# Patient Record
Sex: Female | Born: 1970 | Race: White | Hispanic: Yes | Marital: Single
Health system: Midwestern US, Academic
[De-identification: ages and names within clinical notes are randomized; demographics above are authoritative.]

---

## 2017-03-17 ENCOUNTER — Encounter: Admit: 2017-03-17 | Discharge: 2017-03-17 | Payer: MEDICAID

## 2017-03-17 NOTE — Telephone Encounter
Pt- Requesting venlafaxine XR (EFFEXOR XR) 37.5 mg capsule refill from pharmacy and needs confirmation that this will be approved, call [phone].

## 2017-03-17 NOTE — Telephone Encounter
Per office protocol patient needs an appointment before refill can be give.  Patient notified of this an appointment was made with Ferdinand Lango.  Patient requested that a sign language interpreter be present.  An e-mail was sent asking for one to be at her appointment.

## 2017-03-30 ENCOUNTER — Encounter: Admit: 2017-03-30 | Discharge: 2017-03-30 | Payer: MEDICAID

## 2017-03-30 ENCOUNTER — Ambulatory Visit: Admit: 2017-03-30 | Discharge: 2017-03-30 | Payer: MEDICAID

## 2017-03-30 DIAGNOSIS — R42 Dizziness and giddiness: ICD-10-CM

## 2017-03-30 DIAGNOSIS — H9190 Unspecified hearing loss, unspecified ear: ICD-10-CM

## 2017-03-30 DIAGNOSIS — R51 Headache: ICD-10-CM

## 2017-03-30 DIAGNOSIS — M1990 Unspecified osteoarthritis, unspecified site: ICD-10-CM

## 2017-03-30 DIAGNOSIS — E119 Type 2 diabetes mellitus without complications: ICD-10-CM

## 2017-03-30 DIAGNOSIS — E7800 Pure hypercholesterolemia, unspecified: ICD-10-CM

## 2017-03-30 DIAGNOSIS — J45909 Unspecified asthma, uncomplicated: Principal | ICD-10-CM

## 2017-03-30 MED ORDER — VENLAFAXINE 37.5 MG PO CP24
ORAL_CAPSULE | 3 refills | Status: AC
Start: 2017-03-30 — End: 2017-10-05

## 2017-03-30 NOTE — Progress Notes
Date of Service: 03/30/2017    Subjective:             Tonya Hayes is a 46 y.o. female.    History of Present Illness    Tonya Hayes is here for annual follow up.  She reports significant improvement in her vertigo symptoms with effexor.  She is taking 75mg /day and tolerating side effects well.  Get gets labs annually with PCP.  She is very happy with her progress and does not want to wean off.  She does still get a little off with up and down/extra movement at work as well as when her stressors are high.  If possible she would like to tighten symptoms control.       Review of Systems   HENT: Positive for tinnitus.    Eyes: Negative.    Respiratory: Negative.    Cardiovascular: Negative.    Gastrointestinal: Negative.    Endocrine: Negative.    Genitourinary: Negative.    Musculoskeletal: Negative.    Skin: Negative.    Allergic/Immunologic: Negative.    Neurological: Positive for light-headedness.   Hematological: Negative.    Psychiatric/Behavioral: Negative.          Objective:         ??? ALBUTEROL IN Inhale  by mouth into the lungs.   ??? aspirin 81 mg chewable tablet Chew 81 mg by mouth as Needed for Pain. Take with food.   ??? bevacizumab (AVASTIN) 25 mg/mL injection Administer  through vein once.   ??? dapagliflozin 10 mg tab Take 10 mg by mouth daily with breakfast.   ??? ondansetron (ZOFRAN) 4 mg tablet Take 4 mg by mouth as Needed for Nausea or Vomiting.   ??? Saxagliptin 5 mg tab Take 5 mg by mouth daily.   ??? venlafaxine XR (EFFEXOR XR) 37.5 mg capsule Take three tabs po qd.  Take with food.     Vitals:    03/30/17 1416   BP: 142/82   Pulse: 93   Weight: 71.2 kg (157 lb)   Height: 149.9 cm (59)     Body mass index is 31.71 kg/m???.     Physical Exam    General appearance: well developed, well nourished, no acute distress  Communication ability: communicates by voice, normal quality  Psychiatric: oriented to person, place and time with appropriate affect Inspection: normocephalic, no scars, lesions, or masses.  Facial motion grossly intact.  External ear: normal, no lesions or deformities no post auricular fluctuance or tenderness.  Otoscopic: canals clear, tympanic membranes intact, no fluid         Assessment and Plan:  Tonya Hayes is here for annual follow up.  She reports significant improvement in her vertigo symptoms with effexor.  She is taking 75mg /day and tolerating side effects well.  Get gets labs annually with PCP.  She is very happy with her progress and does not want to wean off.  She does still get a little off with up and down/extra movement at work as well as when her stressors are high.  If possible she would like to tighten symptoms control.      Exam normal.    1. Vertigo         Doing very well on 75mg  effexor xr per day.  Will try to tighten control of breakthrough syptoms with increasing to 112.5mg / day (three 37.5 mg tabs).  Can go up to 150mg /day if needed.  Tonya Hayes expressed understanding and appreciation.    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

## 2017-09-26 ENCOUNTER — Encounter: Admit: 2017-09-26 | Discharge: 2017-09-27 | Payer: MEDICAID

## 2017-10-05 ENCOUNTER — Encounter: Admit: 2017-10-05 | Discharge: 2017-10-05 | Payer: MEDICAID

## 2017-10-05 MED ORDER — VENLAFAXINE 75 MG PO CP24
75 mg | ORAL_CAPSULE | Freq: Every day | ORAL | 3 refills | Status: AC
Start: 2017-10-05 — End: 2018-12-04

## 2017-10-25 ENCOUNTER — Ambulatory Visit: Admit: 2017-10-25 | Discharge: 2017-10-25 | Payer: MEDICAID

## 2017-10-25 ENCOUNTER — Encounter: Admit: 2017-10-25 | Discharge: 2017-10-25 | Payer: MEDICAID

## 2017-10-25 DIAGNOSIS — R51 Headache: ICD-10-CM

## 2017-10-25 DIAGNOSIS — J45909 Unspecified asthma, uncomplicated: Principal | ICD-10-CM

## 2017-10-25 DIAGNOSIS — E7800 Pure hypercholesterolemia, unspecified: ICD-10-CM

## 2017-10-25 DIAGNOSIS — M1990 Unspecified osteoarthritis, unspecified site: ICD-10-CM

## 2017-10-25 DIAGNOSIS — E119 Type 2 diabetes mellitus without complications: ICD-10-CM

## 2017-10-25 DIAGNOSIS — R42 Dizziness and giddiness: ICD-10-CM

## 2017-10-25 DIAGNOSIS — M7712 Lateral epicondylitis, left elbow: Principal | ICD-10-CM

## 2017-10-25 DIAGNOSIS — H9190 Unspecified hearing loss, unspecified ear: ICD-10-CM

## 2018-02-15 ENCOUNTER — Encounter: Admit: 2018-02-15 | Discharge: 2018-02-15 | Payer: MEDICAID

## 2018-12-03 ENCOUNTER — Encounter: Admit: 2018-12-03 | Discharge: 2018-12-03

## 2018-12-03 NOTE — Telephone Encounter
PT Calling to reach out on medication to arrive but hasnt

## 2018-12-04 ENCOUNTER — Encounter: Admit: 2018-12-04 | Discharge: 2018-12-04

## 2018-12-04 MED ORDER — VENLAFAXINE 75 MG PO CP24
75 mg | ORAL_CAPSULE | Freq: Every day | ORAL | 0 refills | Status: DC
Start: 2018-12-04 — End: 2019-02-20

## 2018-12-04 NOTE — Telephone Encounter
It has been > 1 year since last seen.  She will need an in person office visit, telehealth (zoom or phone), OR better yet transfer rx to PCP.

## 2019-02-14 ENCOUNTER — Encounter: Admit: 2019-02-14 | Discharge: 2019-02-14

## 2019-02-14 NOTE — Telephone Encounter
Pt calling for refill on her Prescription

## 2019-02-14 NOTE — Telephone Encounter
Called patient and left a message with her interpreter saying that before Tonya Hayes will refill her medication she needs to call and schedule an appointment because its been over a year since she was seen. Patient is hearing impaired so I left a message.

## 2019-02-20 ENCOUNTER — Encounter: Admit: 2019-02-20 | Discharge: 2019-02-20

## 2019-02-20 MED ORDER — VENLAFAXINE 75 MG PO CP24
75 mg | ORAL_CAPSULE | Freq: Every day | ORAL | 0 refills | Status: DC
Start: 2019-02-20 — End: 2019-04-16

## 2019-02-20 NOTE — Telephone Encounter
Called patient to pre room her for a telephone visit. Patient did not answer. LVM for her to call back.

## 2019-02-21 ENCOUNTER — Encounter: Admit: 2019-02-21 | Discharge: 2019-02-21

## 2019-04-16 ENCOUNTER — Encounter: Admit: 2019-04-16 | Discharge: 2019-04-16 | Payer: MEDICAID

## 2019-04-16 DIAGNOSIS — H9190 Unspecified hearing loss, unspecified ear: Secondary | ICD-10-CM

## 2019-04-16 DIAGNOSIS — J45909 Unspecified asthma, uncomplicated: Secondary | ICD-10-CM

## 2019-04-16 DIAGNOSIS — R519 Generalized headaches: Secondary | ICD-10-CM

## 2019-04-16 DIAGNOSIS — E119 Type 2 diabetes mellitus without complications: Secondary | ICD-10-CM

## 2019-04-16 DIAGNOSIS — E7800 Pure hypercholesterolemia, unspecified: Secondary | ICD-10-CM

## 2019-04-16 DIAGNOSIS — M1990 Unspecified osteoarthritis, unspecified site: Secondary | ICD-10-CM

## 2019-04-16 DIAGNOSIS — R42 Dizziness and giddiness: Secondary | ICD-10-CM

## 2019-04-16 NOTE — Telephone Encounter
Calling regarding a refill

## 2019-11-26 ENCOUNTER — Encounter: Admit: 2019-11-26 | Discharge: 2019-11-26 | Payer: MEDICAID

## 2019-11-26 ENCOUNTER — Ambulatory Visit: Admit: 2019-11-26 | Discharge: 2019-11-26 | Payer: MEDICAID

## 2019-11-26 DIAGNOSIS — H9190 Unspecified hearing loss, unspecified ear: Secondary | ICD-10-CM

## 2019-11-26 DIAGNOSIS — R519 Generalized headaches: Secondary | ICD-10-CM

## 2019-11-26 DIAGNOSIS — J45909 Unspecified asthma, uncomplicated: Secondary | ICD-10-CM

## 2019-11-26 DIAGNOSIS — E119 Type 2 diabetes mellitus without complications: Secondary | ICD-10-CM

## 2019-11-26 DIAGNOSIS — M1990 Unspecified osteoarthritis, unspecified site: Secondary | ICD-10-CM

## 2019-11-26 DIAGNOSIS — E7800 Pure hypercholesterolemia, unspecified: Secondary | ICD-10-CM

## 2019-11-26 DIAGNOSIS — R42 Dizziness and giddiness: Secondary | ICD-10-CM

## 2019-11-26 MED ORDER — VENLAFAXINE 75 MG PO CP24
75 mg | ORAL_CAPSULE | Freq: Every day | ORAL | 11 refills | Status: AC
Start: 2019-11-26 — End: ?

## 2019-11-26 NOTE — Progress Notes
Date of Service: 11/26/2019    Subjective:             Tonya Hayes is a 49 y.o. female.    History of Present Illness    Tonya Hayes is here for vertigo/medication refill.    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  She has been doing well with effexor.  She currently continues to get at least annual visits with her PCP including labs.    ?  To review, she had been having nearly constant vague disequilibrium and nausea that had started with a few severe vertigo episodes 2016. 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 ?When she presented initially 08/2015 here, she was fairly dependent on scopolamine, indicating as long as she is on the patch she is mostly ok but when the 72 hours is drawing near for patch replacement, she starts getting dizzy again. ?  ?  She was sent for vestibular testing but couldn't even get through a few seconds of testing. ?This is quite unusual and suspicious for a significant anxiety component. ?She ultimately was started on effexor and today reports she has tolerated it well, her dizziness is quite well controlled now and she is very happy with that.       Review of Systems   Constitutional: Negative.    HENT: Negative.    Eyes: Negative.    Respiratory: Negative.    Cardiovascular: Negative.    Gastrointestinal: Negative.    Endocrine: Negative.    Genitourinary: Negative.    Musculoskeletal: Negative.    Skin: Negative.    Allergic/Immunologic: Negative.    Neurological: Negative.    Hematological: Negative.    Psychiatric/Behavioral: Negative.          Objective:         ? ALBUTEROL IN Inhale  by mouth into the lungs.   ? aspirin 81 mg chewable tablet Chew 81 mg by mouth as Needed for Pain. Take with food.   ? bevacizumab (AVASTIN) 25 mg/mL injection Administer  through vein once.   ? dapagliflozin 10 mg tab Take 10 mg by mouth daily with breakfast.   ? ondansetron (ZOFRAN) 4 mg tablet Take 4 mg by mouth as Needed for Nausea or Vomiting.   ? Saxagliptin 5 mg tab Take 5 mg by mouth daily.   ? venlafaxine XR (EFFEXOR XR) 75 mg capsule Take one capsule by mouth daily. Take with food.     Vitals:    11/26/19 1350   BP: (!) 159/83   Pulse: 98   Weight: 72.6 kg (160 lb)   Height: 149.9 cm (59)   PainSc: Zero     Body mass index is 32.32 kg/m?Marland Kitchen     Physical Exam    General appearance: well developed, well nourished, no acute distress  Communication ability: communicates by voice, normal quality  Psychiatric: oriented to person, place and time with appropriate affect  Inspection: normocephalic, no scars, lesions, or masses.  Facial motion grossly intact.  External ear: normal, no lesions or deformities no post auricular fluctuance or tenderness.  Otoscopic: canals clear, tympanic membranes intact, no fluid       Assessment and Plan:  1. Vertigo         Effexor XR 75mg  refilled x 1 year.  Can transfer rx to PCP or return annually.  Discussed weaning but this is difficult for some patients with this medication; she is doing well and would like to stay at  this dose which is reasonable.  Continue routine follow up/labs with PCP, otherwise RTC to see me in 1 year.  Tonya Hayes expressed understanding and appreciation.    Interview taken via in person translator as the patient is ASL speaking only.

## 2020-05-23 ENCOUNTER — Encounter: Admit: 2020-05-23 | Discharge: 2020-05-23 | Payer: MEDICAID

## 2020-05-24 MED ORDER — VENLAFAXINE 75 MG PO CP24
ORAL_CAPSULE | Freq: Every day | 11 refills | Status: AC
Start: 2020-05-24 — End: ?

## 2021-02-04 ENCOUNTER — Ambulatory Visit: Admit: 2021-02-04 | Discharge: 2021-02-04 | Payer: MEDICAID

## 2021-02-04 ENCOUNTER — Encounter: Admit: 2021-02-04 | Discharge: 2021-02-04 | Payer: MEDICAID

## 2021-02-04 DIAGNOSIS — K029 Dental caries, unspecified: Secondary | ICD-10-CM

## 2021-02-04 DIAGNOSIS — H9190 Unspecified hearing loss, unspecified ear: Secondary | ICD-10-CM

## 2021-02-04 DIAGNOSIS — R42 Dizziness and giddiness: Secondary | ICD-10-CM

## 2021-02-04 DIAGNOSIS — E7800 Pure hypercholesterolemia, unspecified: Secondary | ICD-10-CM

## 2021-02-04 DIAGNOSIS — R6884 Jaw pain: Secondary | ICD-10-CM

## 2021-02-04 DIAGNOSIS — H903 Sensorineural hearing loss, bilateral: Secondary | ICD-10-CM

## 2021-02-04 DIAGNOSIS — J45909 Unspecified asthma, uncomplicated: Secondary | ICD-10-CM

## 2021-02-04 DIAGNOSIS — E119 Type 2 diabetes mellitus without complications: Secondary | ICD-10-CM

## 2021-02-04 DIAGNOSIS — H9202 Otalgia, left ear: Secondary | ICD-10-CM

## 2021-02-04 DIAGNOSIS — M1990 Unspecified osteoarthritis, unspecified site: Secondary | ICD-10-CM

## 2021-02-04 DIAGNOSIS — R519 Generalized headaches: Secondary | ICD-10-CM

## 2021-02-04 NOTE — Progress Notes
No audiogram needed.

## 2021-02-04 NOTE — Progress Notes
Date of Service: 02/04/2021    Subjective:             Tonya Hayes is a 50 y.o. female.    History of Present Illness    Tonya Hayes is here for L TMJ pain.  This started suddenly, with a pop, and has hurt ever since.  There is pain with opening the jaw.  It feels like it is in her ear, too.  She tried seeing her dentist but they could not get an interpreter and would not see her.  It seems there was some communication issues.  She knows she needs to see a dentist for oral/dental health.  She is deaf and communicates via ASL.      Otherwise, she has done well on effexor for central vertigo.  However, her PCP has told her she would like her to go down on the dose due to the potential for negative impact on diabetes.       Review of Systems   Constitutional: Negative.    HENT: Positive for ear pain (pressure, Left ear).    Eyes: Negative.    Respiratory: Negative.    Cardiovascular: Negative.    Gastrointestinal: Negative.    Endocrine: Negative.    Genitourinary: Negative.    Musculoskeletal: Negative.    Skin: Negative.    Allergic/Immunologic: Negative.    Neurological: Negative.    Hematological: Negative.    Psychiatric/Behavioral: Negative.          Objective:         ? ALBUTEROL IN Inhale  by mouth into the lungs.   ? aspirin 81 mg chewable tablet Chew 81 mg by mouth as Needed for Pain. Take with food.   ? atorvastatin (LIPITOR) 10 mg tablet    ? bevacizumab (AVASTIN) 25 mg/mL injection Administer  through vein once.   ? cyclobenzaprine (FLEXERIL) 5 mg tablet Take 5 mg by mouth at bedtime daily.   ? dapagliflozin 10 mg tab Take 10 mg by mouth daily with breakfast.   ? diazePAM (VALIUM) 5 mg tablet TAKE 1 TABLET BY MOUTH AT BEDTIME AS NEEDED FOR ANXIETY   ? glipiZIDE (GLUCOTROL) 5 mg tablet Take 5 mg by mouth daily.   ? JARDIANCE 10 mg tablet Take 10 mg by mouth every morning.   ? metFORMIN-XR (GLUCOPHAGE XR) 500 mg extended release tablet    ? ondansetron (ZOFRAN) 4 mg tablet Take 4 mg by mouth as Needed for Nausea or Vomiting.   ? Saxagliptin 5 mg tab Take 5 mg by mouth daily.   ? venlafaxine XR (EFFEXOR XR) 75 mg capsule TAKE 1 CAPSULE BY MOUTH DAILY WITH FOOD     Vitals:    02/04/21 1032   BP: (!) 144/84   Pulse: 81   PainSc: Two   Weight: 66.7 kg (147 lb 0.8 oz)   Height: 149 cm (4' 10.66)     Body mass index is 30.04 kg/m?Marland Kitchen     Physical Exam    General appearance: well developed, well nourished, no acute distress  Communication ability: communicates by ASL, via interpreter  Psychiatric: oriented to person, place and time with appropriate affect  Inspection: normocephalic, no scars, lesions, or masses.  Facial motion grossly intact.  External ear: normal, no lesions or deformities no post auricular fluctuance or tenderness.  Otoscopic: canals clear, tympanic membranes intact, no fluid  External nose: normal, no lesions or deformities  Lips/teeth/gums: poor dentition, no obvious abscess, no discrete tooth pain  Oropharynx: tongue  normal, posterior pharynx without erythema or exudate.  Hard and soft palate without focal lesion.  Neck: supple, no mass or lesion.  TMJ exquisitely tender.     Assessment and Plan:    1. Left ear pain     2. Dental caries  AMB REFERRAL TO DENTISTRY-EXTERNAL   3. Jaw pain  AMB REFERRAL TO DENTISTRY-EXTERNAL   4. Vertigo       She has ear pain related to jaw problems.  Recommend gentle stretching, massage, scheduled NSAID with food, warm compress.  She has poor dental health.  Referral placed to [facility] and she was given contact information.      It is ok to decrease the effexor dose, will leave to PCP.  She understands she may experience more vertigo, and we can address that if need be.  Tonya Hayes expressed understanding and appreciation.

## 2021-02-05 ENCOUNTER — Encounter: Admit: 2021-02-05 | Discharge: 2021-02-05 | Payer: MEDICAID

## 2021-12-27 ENCOUNTER — Encounter: Admit: 2021-12-27 | Discharge: 2021-12-27 | Payer: MEDICAID

## 2021-12-27 NOTE — Telephone Encounter
Pt calling regarding a refill on her Vertigo medication wasn't sure of the name

## 2021-12-28 ENCOUNTER — Encounter: Admit: 2021-12-28 | Discharge: 2021-12-28 | Payer: MEDICAID

## 2021-12-28 NOTE — Telephone Encounter
This nurse returned patient call regarding medication refill. This nurse LVM for patient to call clinic back to discuss refill. [phone].    Stanford Breed, RN

## 2022-01-10 ENCOUNTER — Encounter: Admit: 2022-01-10 | Discharge: 2022-01-10 | Payer: MEDICAID

## 2022-01-10 NOTE — Telephone Encounter
Patient last seen  20222. Asking for refill of balance medication be sent to [facility] at  [phone] , [place] . Patient did not want to make appointment.

## 2022-01-10 NOTE — Telephone Encounter
This nurse called patient and used interpreter services. Patient is requesting a refill on her Effexor. This nurse informed patient per Ferdinand Lango PA-C office note "It is ok to decrease the effexor dose, will leave to PCP." This nurse informed patient that she would need to follow-up with pcp for refills. Patient expressed understanding and will contact pcp office. [phone].      Stanford Breed, RN

## 2022-01-18 ENCOUNTER — Encounter: Admit: 2022-01-18 | Discharge: 2022-01-18 | Payer: MEDICAID

## 2022-01-18 MED ORDER — VENLAFAXINE 75 MG PO CP24
75 mg | ORAL_CAPSULE | Freq: Every day | ORAL | 1 refills | Status: AC
Start: 2022-01-18 — End: ?

## 2022-01-19 ENCOUNTER — Encounter: Admit: 2022-01-19 | Discharge: 2022-01-19 | Payer: MEDICAID

## 2022-01-19 NOTE — Telephone Encounter
This nurse called and LVM using interpreter to let patient know that provider sent in a 30 day refill plus refill of Effexor. Patient was instructed to call clinic back to schedule follow-up appointment to determine pcp and to discuss who will take over medication. [phone].    Stanford Breed, RN

## 2022-02-09 ENCOUNTER — Encounter: Admit: 2022-02-09 | Discharge: 2022-02-09 | Payer: MEDICAID

## 2022-06-09 ENCOUNTER — Encounter: Admit: 2022-06-09 | Discharge: 2022-06-09 | Payer: MEDICAID

## 2022-06-09 ENCOUNTER — Ambulatory Visit: Admit: 2022-06-09 | Discharge: 2022-06-09 | Payer: MEDICAID

## 2022-06-09 DIAGNOSIS — H6121 Impacted cerumen, right ear: Secondary | ICD-10-CM

## 2022-06-09 DIAGNOSIS — E119 Type 2 diabetes mellitus without complications: Secondary | ICD-10-CM

## 2022-06-09 DIAGNOSIS — H903 Sensorineural hearing loss, bilateral: Secondary | ICD-10-CM

## 2022-06-09 DIAGNOSIS — M1990 Unspecified osteoarthritis, unspecified site: Secondary | ICD-10-CM

## 2022-06-09 DIAGNOSIS — J45909 Unspecified asthma, uncomplicated: Secondary | ICD-10-CM

## 2022-06-09 DIAGNOSIS — R519 Generalized headaches: Secondary | ICD-10-CM

## 2022-06-09 DIAGNOSIS — R42 Dizziness and giddiness: Secondary | ICD-10-CM

## 2022-06-09 DIAGNOSIS — R051 Acute cough: Secondary | ICD-10-CM

## 2022-06-09 DIAGNOSIS — E7800 Pure hypercholesterolemia, unspecified: Secondary | ICD-10-CM

## 2022-06-09 DIAGNOSIS — H9312 Tinnitus, left ear: Secondary | ICD-10-CM

## 2022-06-09 DIAGNOSIS — H9190 Unspecified hearing loss, unspecified ear: Secondary | ICD-10-CM

## 2022-06-09 NOTE — Progress Notes
Date of Service: 06/09/2022    Subjective:             Tonya Hayes is a 51 y.o. female.    History of Present Illness    Tonya Hayes is here for increased tinnitus.  Difficult historian, we do have an ASL interpreter with [place] today.      She has been able to get her effexor through her PCP.  However, she indicates at some point, the refills stopped and she has been just waiting.  Through a series of questions, I was able to discern she has only been without for a few days.      Increased tinnitus and dizziness started with a head injury; she fell and hit her head and had disorientation.  She was evaluated in the ED, I reviewed records.  She was discharged without intervention needed.  CT neg for bleed etc.    Also following with PCP for comorbidities including difficult to control HTN, low then high iron and has diabetes.    She also developed a cough this AM.  No SOA.  Wonders if she is getting sick and/or needs an rx.  Has asthma, uses a rescue inhaler PRN.         Review of Systems   Constitutional: Negative.    HENT: Negative.     Eyes: Negative.    Respiratory: Negative.     Cardiovascular: Negative.    Gastrointestinal: Negative.    Endocrine: Negative.    Genitourinary: Negative.    Musculoskeletal: Negative.    Skin: Negative.    Allergic/Immunologic: Negative.    Neurological: Negative.    Hematological: Negative.    Psychiatric/Behavioral: Negative.           Objective:          ALBUTEROL IN Inhale  by mouth into the lungs.    aspirin 81 mg chewable tablet Chew 81 mg by mouth as Needed for Pain. Take with food.    atorvastatin (LIPITOR) 10 mg tablet     bevacizumab (AVASTIN) 25 mg/mL injection Administer  through vein once.    cyclobenzaprine (FLEXERIL) 5 mg tablet Take 5 mg by mouth at bedtime daily.    dapagliflozin 10 mg tab Take 10 mg by mouth daily with breakfast.    diazePAM (VALIUM) 5 mg tablet TAKE 1 TABLET BY MOUTH AT BEDTIME AS NEEDED FOR ANXIETY    glipiZIDE (GLUCOTROL) 5 mg tablet Take 5 mg by mouth daily.    JARDIANCE 10 mg tablet Take 10 mg by mouth every morning.    metFORMIN-XR (GLUCOPHAGE XR) 500 mg extended release tablet     ondansetron (ZOFRAN) 4 mg tablet Take 4 mg by mouth as Needed for Nausea or Vomiting.    Saxagliptin 5 mg tab Take 5 mg by mouth daily.    venlafaxine XR (EFFEXOR XR) 75 mg capsule Take one capsule by mouth daily. TAKE WITH FOOD     Vitals:    06/09/22 0832   BP: (!) 150/83   BP Source: Arm, Left Upper   Pulse: 97   Temp: 36.5 ?C (97.7 ?F)   TempSrc: Skin   PainSc: Zero   Weight: 64 kg (141 lb)   Height: 149.9 cm (4' 11)     Body mass index is 28.48 kg/m?Marland Kitchen     Physical Exam    General appearance: well developed, well nourished, no acute distress  Communication ability: communicates by voice, normal quality  Psychiatric: oriented to person, place and time  with appropriate affect  Inspection: normocephalic, no scars, lesions, or masses.  Facial motion grossly intact.  External ear: normal, no lesions or deformities no post auricular fluctuance or tenderness.  Otoscopic: (Separate Procedure: Wax Removal)  Under binocular microscopy, the left ear canal is clear, TM is healthy without effusion.  The right canal is occluded with hard cerumen, which was obstructing 75% of the TM from being well visualized.  This impaction was extracted in entirety using pick.  This was accomplished with minimal difficulty.  The underlying tympanic membrane is healthy appearing\ without retraction, effusion, or infection.    Lips/teeth/gums: normal dentition, no gingival inflammation, no labial lesions  Oropharynx: tongue normal, posterior pharynx without erythema or exudate.  Hard and soft palate without focal lesion.  Lungs with scattered wheezing.  No distress.  No audible wheezing in room.  +dry cough observed.     Assessment and Plan:    1. Tinnitus of left ear        2. Impacted cerumen of right ear        3. Acute cough            Removed R wax.    Discussed tinnitus is a complex problem. Difficult to assess from ENT standpoint as all our testing and interventions rely on usable hearing.  We confirmed again today little to no responses on audiogram.  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  I also called her pharmacy and confirmed she does have a prescription there for effexor with refills so will discharge her from ENT to prn follow up as PCP can take over effexor rx.    New cough in chronic asthma patient.  I told Gini that I do not know if this is simply an allergy/asthma flare or start of an illness.  I recommend taking her albuterol as prescribed and notifying PCP office or going to UC if symptoms persist or worsen.      All questions were answered to the best of my ability.  Cassie Shedlock expressed understanding and appreciation.

## 2023-08-23 ENCOUNTER — Encounter: Admit: 2023-08-23 | Discharge: 2023-08-23 | Payer: MEDICAID

## 2023-11-01 ENCOUNTER — Encounter: Admit: 2023-11-01 | Discharge: 2023-11-01 | Payer: MEDICAID

## 2023-11-04 ENCOUNTER — Encounter: Admit: 2023-11-04 | Discharge: 2023-11-04 | Payer: MEDICAID

## 2023-11-05 ENCOUNTER — Encounter: Admit: 2023-11-05 | Discharge: 2023-11-05 | Payer: MEDICAID

## 2023-11-06 ENCOUNTER — Encounter: Admit: 2023-11-06 | Discharge: 2023-11-06 | Payer: MEDICAID

## 2023-11-08 ENCOUNTER — Encounter: Admit: 2023-11-08 | Discharge: 2023-11-08 | Payer: MEDICAID

## 2023-11-08 NOTE — Telephone Encounter
 ED Discharge Follow Up  Reached patient: Seen in clinic 24-48 hours post discharge  Patient Date of Birth: July 18, 1970  Admission Information  Hospital Name : [facility] [place]  [facility]  ED Admission Date: 11/04/23   ED Discharge Date: 11/04/23   Admission Diagnosis: Abdominal pain  Discharge Diagnosis: Nausea and vomiting, unspecified vomiting type   Hospital Services: Unplanned  Today's call is 4 (calendar) days post discharge    Medication Reconciliation  Changes to pre-ED visit medications? No  Were new prescriptions filled? N/A  Meds reviewed and reconciled? Yes    Current Outpatient Medications   Medication Instructions    ALBUTEROL IN Inhale  by mouth into the lungs.    albuterol sulfate (PROAIR HFA) 90 mcg/actuation HFA aerosol inhaler INHALE 1 PUFF BY MOUTH EVERY 4 HOURS AS NEEDED FOR WHEEZING    aspirin 81 mg, AS NEEDED    atorvastatin (LIPITOR) 10 mg tablet No dose, route, or frequency recorded.    bevacizumab (AVASTIN) 25 mg/mL injection ONCE    cyclobenzaprine (FLEXERIL) 5 mg, AT BEDTIME DAILY    dapagliflozin propanediol (FARXIGA) 10 mg, DAILY WITH BREAKFAST    diazePAM (VALIUM) 5 mg tablet TAKE 1 TABLET BY MOUTH AT BEDTIME AS NEEDED FOR ANXIETY    fluticasone propionate (FLONASE ALLERGY RELIEF) 50 mcg    fluticasone propionate (FLOVENT HFA) 110 mcg/actuation inhaler = 2 PUFF, Aerosol, Inhalation, BID, # 12 g, 0 Refill(s), Therapy type = Maintenance, Pharmacy: [facility] [phone], 150.2, cm, 10/21/22 14:04:00 CDT, Height Clinical, 63.8, kg, 10/21/22 14:04:00 CDT, Weight Clinical    glipiZIDE (GLUCOTROL) 5 mg, DAILY    glyBURIDE (DIABETA) 5 mg    JARDIANCE 10 mg, EVERY MORNING    latanoprost (XALATAN) 0.005 % ophthalmic solution INSTILL 1 DROP IN BOTH EYES EVERY NIGHT AT BEDTIME    linaGLIPtin (TRADJENTA) 5 mg    meloxicam (MOBIC) 7.5 mg    metFORMIN-XR (GLUCOPHAGE XR) 500 mg extended release tablet No dose, route, or frequency recorded.    metoclopramide HCL (REGLAN) 10 mg omeprazole DR (PRILOSEC) 20 mg capsule TAKE 1 CAPSULE BY MOUTH DAILY BEFORE A MEAL    ondansetron HCL (ZOFRAN) 4 mg, Oral, EVERY  6 HOURS PRN    potassium chloride SR (KLOR-CON M20) 20 mEq tablet 20 mEq, Oral, DAILY    RYBELSUS 3 mg    sAXagliptin (ONGLYZA) 5 mg, DAILY    venlafaxine XR (EFFEXOR XR) 75 mg, Oral, DAILY, TAKE WITH FOOD      Scheduling Follow-up Appointment  Upcoming appointments:   Future Appointments   Date Time Provider Department Center   11/24/2023  7:45 AM MRI - OCC PAV (1.5T) OCC1MRI [facility]     When was patient?s last PCP visit: Visit date not found  PCP primary location: [facility]  PCP appointment scheduled? No, no future appts  Specialist appointment scheduled? No  MyChart message sent? Active in MyChart. No message sent.   Artera text sent? No      ED Communication   Did patient call clinic prior to going to ED? No  Reason patient went to ED: Unable to obtain         ED Discharge Follow Up  Reached patient: Seen in clinic 24-48 hours post discharge  Patient Date of Birth: 05-26-1971  Admission Information  Hospital Name : [facility] [place]  [facility]  ED Admission Date: 11/04/23   ED Discharge Date: 11/04/23   Admission Diagnosis:  Vomiting  Discharge Diagnosis:   Nausea and vomiting,  unspecified vomiting type   Colitis   Abdominal pain, unspecified abdominal location     Hospital Services: Unplanned  Today's call is 4 (calendar) days post discharge    Medication Reconciliation  Changes to pre-ED visit medications? No  Were new prescriptions filled? N/A  Meds reviewed and reconciled? Yes    Current Outpatient Medications   Medication Instructions    ALBUTEROL IN Inhale  by mouth into the lungs.    albuterol sulfate (PROAIR HFA) 90 mcg/actuation HFA aerosol inhaler INHALE 1 PUFF BY MOUTH EVERY 4 HOURS AS NEEDED FOR WHEEZING    aspirin 81 mg, AS NEEDED    atorvastatin (LIPITOR) 10 mg tablet No dose, route, or frequency recorded.    bevacizumab (AVASTIN) 25 mg/mL injection ONCE    cyclobenzaprine (FLEXERIL) 5 mg, AT BEDTIME DAILY    dapagliflozin propanediol (FARXIGA) 10 mg, DAILY WITH BREAKFAST    diazePAM (VALIUM) 5 mg tablet TAKE 1 TABLET BY MOUTH AT BEDTIME AS NEEDED FOR ANXIETY    fluticasone propionate (FLONASE ALLERGY RELIEF) 50 mcg    fluticasone propionate (FLOVENT HFA) 110 mcg/actuation inhaler = 2 PUFF, Aerosol, Inhalation, BID, # 12 g, 0 Refill(s), Therapy type = Maintenance, Pharmacy: [facility] [phone], 150.2, cm, 10/21/22 14:04:00 CDT, Height Clinical, 63.8, kg, 10/21/22 14:04:00 CDT, Weight Clinical    glipiZIDE (GLUCOTROL) 5 mg, DAILY    glyBURIDE (DIABETA) 5 mg    JARDIANCE 10 mg, EVERY MORNING    latanoprost (XALATAN) 0.005 % ophthalmic solution INSTILL 1 DROP IN BOTH EYES EVERY NIGHT AT BEDTIME    linaGLIPtin (TRADJENTA) 5 mg    meloxicam (MOBIC) 7.5 mg    metFORMIN-XR (GLUCOPHAGE XR) 500 mg extended release tablet No dose, route, or frequency recorded.    metoclopramide HCL (REGLAN) 10 mg    omeprazole DR (PRILOSEC) 20 mg capsule TAKE 1 CAPSULE BY MOUTH DAILY BEFORE A MEAL    ondansetron HCL (ZOFRAN) 4 mg, Oral, EVERY  6 HOURS PRN    potassium chloride SR (KLOR-CON M20) 20 mEq tablet 20 mEq, Oral, DAILY    RYBELSUS 3 mg    sAXagliptin (ONGLYZA) 5 mg, DAILY    venlafaxine XR (EFFEXOR XR) 75 mg, Oral, DAILY, TAKE WITH FOOD      Scheduling Follow-up Appointment  Upcoming appointments:   Future Appointments   Date Time Provider Department Center   11/24/2023  7:45 AM MRI - OCC PAV (1.5T) OCC1MRI [facility]     When was patient?s last PCP visit: Visit date not found  PCP primary location: [facility]  PCP appointment scheduled? No, no future appts  Specialist appointment scheduled? No  MyChart message sent? Active in MyChart. No message sent.   Artera text sent? No      ED Communication   Did patient call clinic prior to going to ED? No  Reason patient went to ED: Unable to obtain        ED Discharge Follow Up  Reached patient: Seen in clinic 24-48 hours post discharge  Patient Date of Birth: May 05, 1971  Admission Information  [facility] : [facility] [place]  [facility]  ED Admission Date: 11/05/23   ED Discharge Date: 11/06/23   Admission Diagnosis: Abdominal pain  Discharge Diagnosis:   Other acute gastritis without hemorrhage   Epigastric pain     Hospital Services: Unplanned  Today's call is 2 (calendar) days post discharge    Medication Reconciliation  Changes to pre-ED visit medications? No  Were new prescriptions filled? N/A  Meds reviewed and reconciled? Yes    Current Outpatient Medications   Medication Instructions    ALBUTEROL IN Inhale  by mouth into the lungs.    albuterol sulfate (PROAIR HFA) 90 mcg/actuation HFA aerosol inhaler INHALE 1 PUFF BY MOUTH EVERY 4 HOURS AS NEEDED FOR WHEEZING    aspirin 81 mg, AS NEEDED    atorvastatin (LIPITOR) 10 mg tablet No dose, route, or frequency recorded.    bevacizumab (AVASTIN) 25 mg/mL injection ONCE    cyclobenzaprine (FLEXERIL) 5 mg, AT BEDTIME DAILY    dapagliflozin propanediol (FARXIGA) 10 mg, DAILY WITH BREAKFAST    diazePAM (VALIUM) 5 mg tablet TAKE 1 TABLET BY MOUTH AT BEDTIME AS NEEDED FOR ANXIETY    fluticasone propionate (FLONASE ALLERGY RELIEF) 50 mcg    fluticasone propionate (FLOVENT HFA) 110 mcg/actuation inhaler = 2 PUFF, Aerosol, Inhalation, BID, # 12 g, 0 Refill(s), Therapy type = Maintenance, Pharmacy: [facility] [phone], 150.2, cm, 10/21/22 14:04:00 CDT, Height Clinical, 63.8, kg, 10/21/22 14:04:00 CDT, Weight Clinical    glipiZIDE (GLUCOTROL) 5 mg, DAILY    glyBURIDE (DIABETA) 5 mg    JARDIANCE 10 mg, EVERY MORNING    latanoprost (XALATAN) 0.005 % ophthalmic solution INSTILL 1 DROP IN BOTH EYES EVERY NIGHT AT BEDTIME    linaGLIPtin (TRADJENTA) 5 mg    meloxicam (MOBIC) 7.5 mg    metFORMIN-XR (GLUCOPHAGE XR) 500 mg extended release tablet No dose, route, or frequency recorded.    metoclopramide HCL (REGLAN) 10 mg    omeprazole DR (PRILOSEC) 20 mg capsule TAKE 1 CAPSULE BY MOUTH DAILY BEFORE A MEAL    ondansetron HCL (ZOFRAN) 4 mg, Oral, EVERY  6 HOURS PRN    potassium chloride SR (KLOR-CON M20) 20 mEq tablet 20 mEq, Oral, DAILY    RYBELSUS 3 mg    sAXagliptin (ONGLYZA) 5 mg, DAILY    venlafaxine XR (EFFEXOR XR) 75 mg, Oral, DAILY, TAKE WITH FOOD      Scheduling Follow-up Appointment  Upcoming appointments:   Future Appointments   Date Time Provider Department Center   11/24/2023  7:45 AM MRI - OCC PAV (1.5T) OCC1MRI [facility]     When was patient?s last PCP visit: Visit date not found  PCP primary location: [facility]  PCP appointment scheduled? No, no future appts  Specialist appointment scheduled? No  MyChart message sent? Active in MyChart. No message sent.   Artera text sent? No      ED Communication   Did patient call clinic prior to going to ED? No  Reason patient went to ED: Unable to obtain

## 2023-11-13 ENCOUNTER — Encounter: Admit: 2023-11-13 | Discharge: 2023-11-13 | Payer: MEDICAID

## 2023-11-22 ENCOUNTER — Encounter: Admit: 2023-11-22 | Discharge: 2023-11-22 | Payer: MEDICAID

## 2023-11-24 ENCOUNTER — Encounter: Admit: 2023-11-24 | Discharge: 2023-11-24 | Payer: MEDICAID

## 2023-12-08 ENCOUNTER — Encounter: Admit: 2023-12-08 | Discharge: 2023-12-08 | Payer: MEDICAID

## 2023-12-11 ENCOUNTER — Encounter: Admit: 2023-12-11 | Discharge: 2023-12-11 | Payer: MEDICAID

## 2023-12-11 IMAGING — MR RM Gluteo
6 of 12 series · 21 of 48 positions shown · IV contrast (YES   GO)
Comparison: none

[Series 4: STIR · coronal · 4.0mm · 0.78mm/px · 3 of 35 slices shown]
[im 1/35]
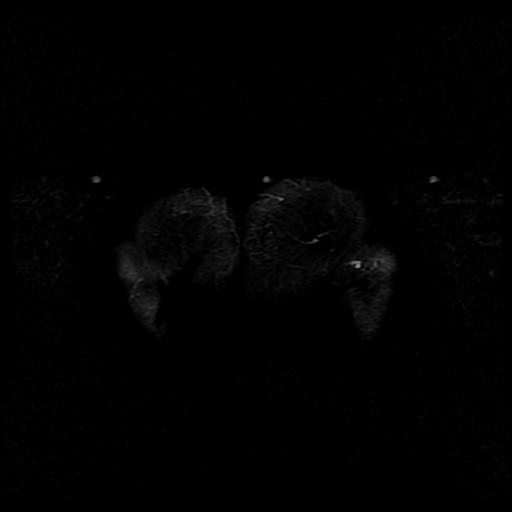
[im 18/35]
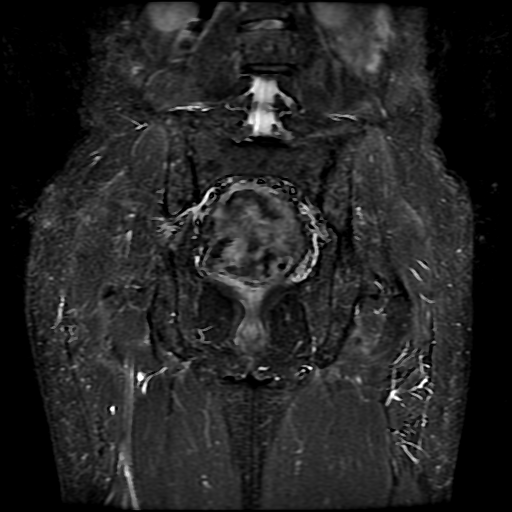
[im 35/35]
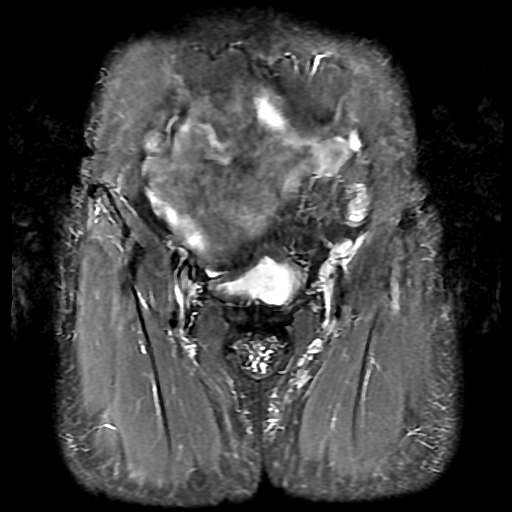

[Series 5: T1 · coronal · 4.0mm · 0.78mm/px · 4 of 35 slices shown (1 of 2)]
[im 1/35]
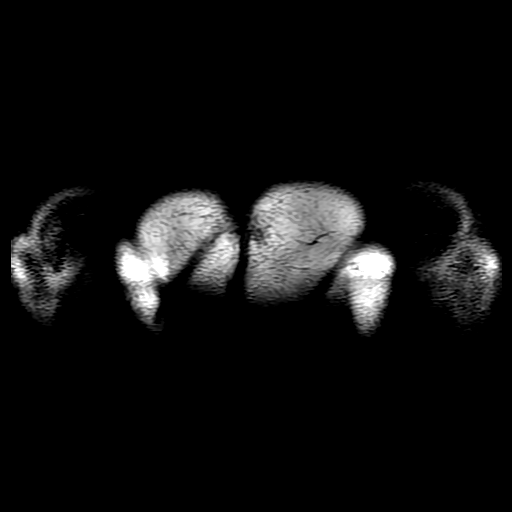
[im 12/35]
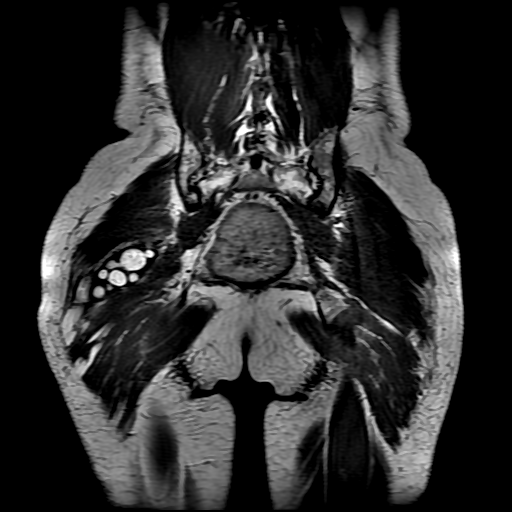
[im 23/35]
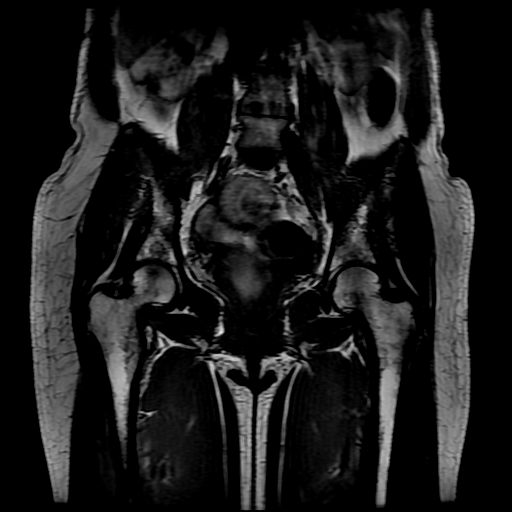
[im 35/35]
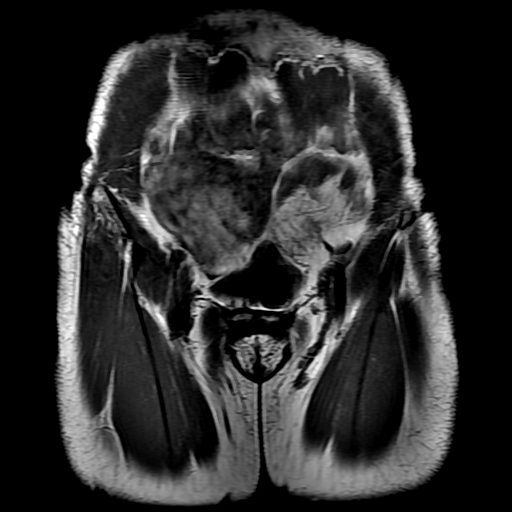

[Series 6: T2 · coronal · 4.0mm · 0.78mm/px · 3 of 31 slices shown (1 of 3)]
[im 1/31]
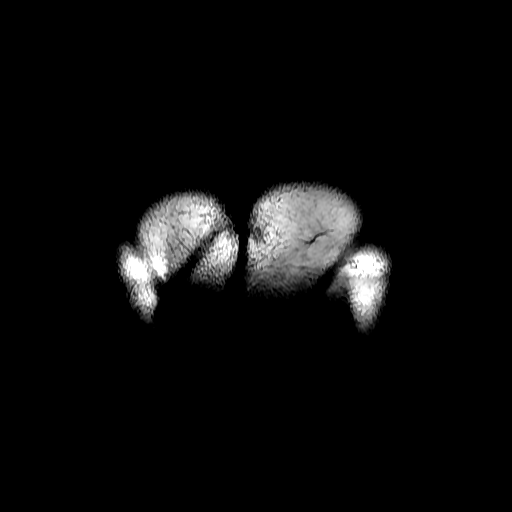
[im 16/31]
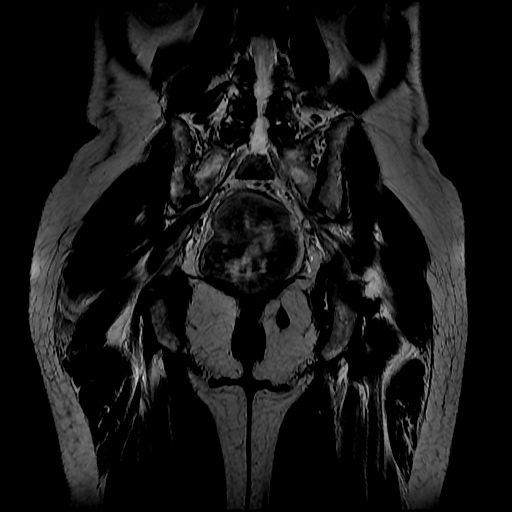
[im 31/31]
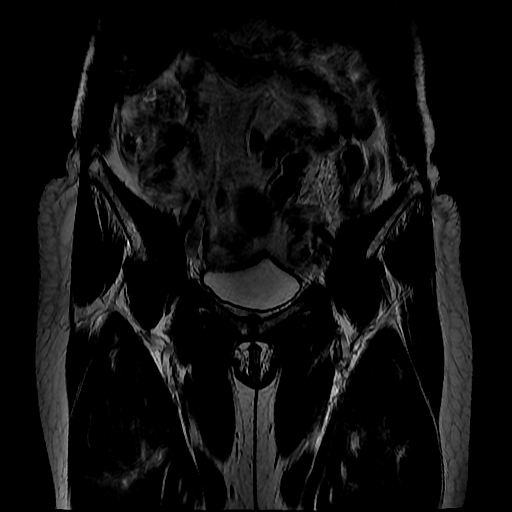

[Series 7: T1 · axial · 5.0mm · 1.37mm/px · z∈[-107,+101]mm · 4 of 33 slices shown (2 of 2)]
[im 1/33]
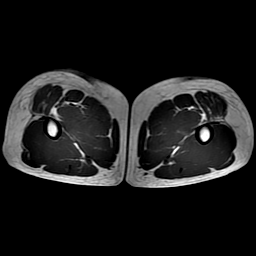
[im 11/33]
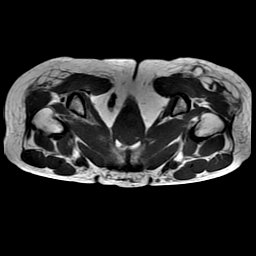
[im 22/33]
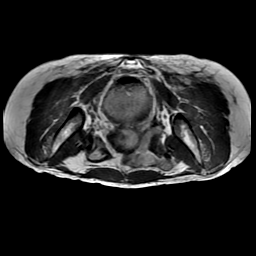
[im 33/33]
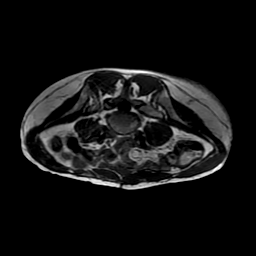

[Series 8: T2 · axial · 5.0mm · 0.68mm/px · z∈[-96,+112]mm · 4 of 33 slices shown (2 of 3)]
[im 1/33]
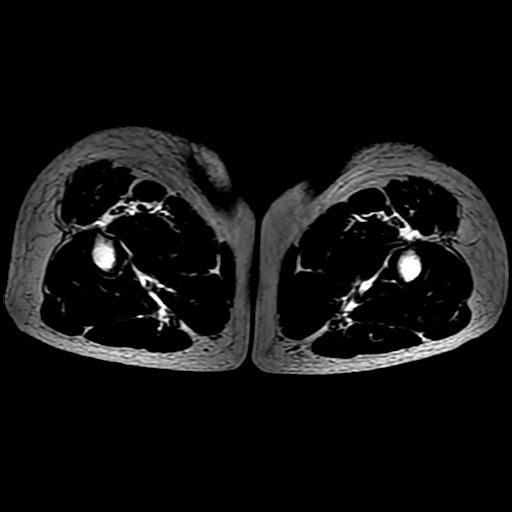
[im 11/33]
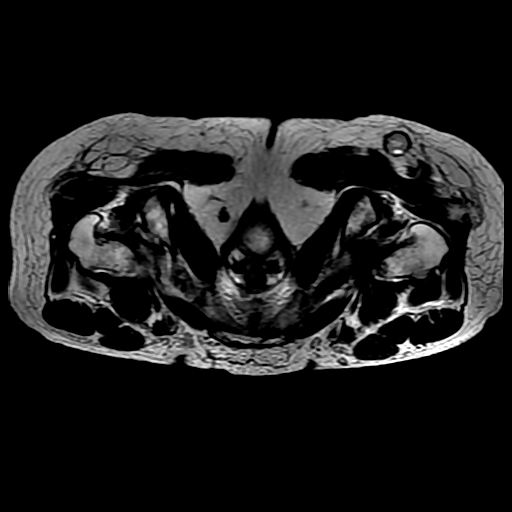
[im 22/33]
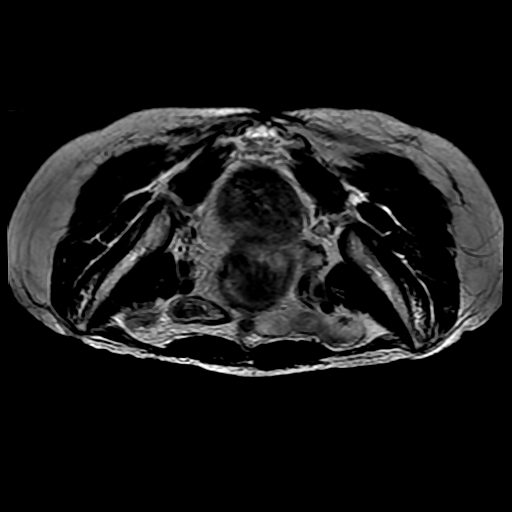
[im 33/33]
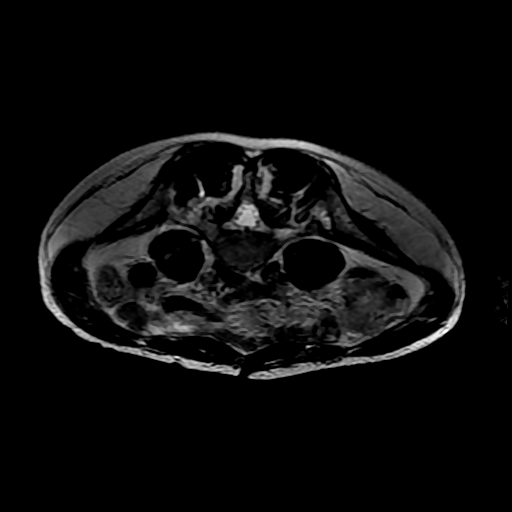

[Series 9: T2 · axial · 5.0mm · 0.68mm/px · z∈[-96,+40]mm · 3 of 33 slices shown (3 of 3)]
[im 1/33]
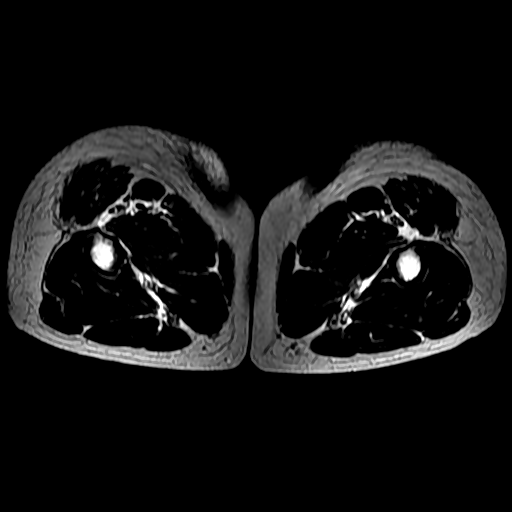
[im 11/33]
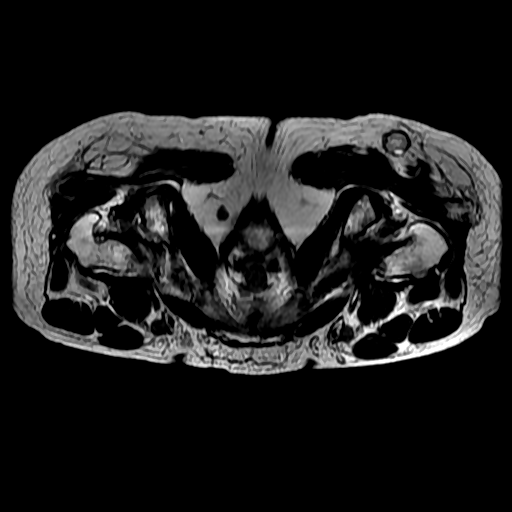
[im 22/33]
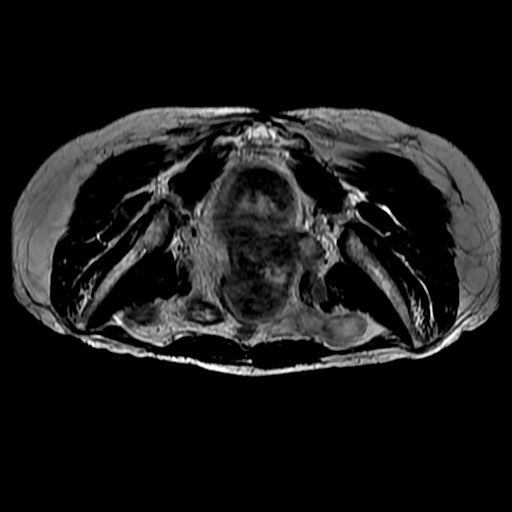

[21 of 48 positions shown; findings below may reference images not displayed]

RESSONÂNCIA MAGNÉTICA DOS GLÚTEOS

Indicação clínica:
Siliconomas.

Metodologia:
Exame realizado em equipamento de ressonância magnética com sequências, ponderações e planos específicos para o segmento de interesse, antes e após a administração endovenosa do meio de contraste.

Análise:
Desarranjo arquitetural com bandas fibrosas permeando o plano subcutâneo nas regiões glúteas, relacionado a manipulação cirúrgica pregressa.
Imagem nodular com componente predominantemente gorduroso e cápsula periférica fibrosa, medindo cerca de 2,1 x 1,5 cm, localizada na profundidade do plano subcutâneo da região glútea direita, sem infiltração dos planos profundos. Há outras pequenas imagens nodulares com características de sinal similares, medindo até 0,9 cm, localizadas na profundidade do plano subcutâneo na região glútea esquerda, adjacente à fáscia muscular superficial do glúteo máximo.
Coleção alongada intramuscular no glúteo máximo direito, que apresenta múltiplas formações arredondadas com componente gorduroso em suspensão no seu interior, associadas a cápsula fibrosa periférica com realce pelo meio de contraste paramagnético. Esta coleção mede cerca de 8,5 x 2,7 x 2,2 cm (volume de 26,3 cm³) com discreto edema nas partes moles adjacentes.
Demais planos musculares glúteos normotróficos.
Conclusão:
Distorção arquitetural pós-cirúrgica com alterações fibrocicatriciais no plano subcutâneo das regiões glúteas, notando-se focos nodulares sugestivos de esteatonecrose crônica de permeio, sendo o maior no subcutâneo da região glútea à direita.
Coleção líquida com "glóbulos gordurosos" em suspensão no seu interior, de localização intramuscular no glúteo máximo direito, sugestiva de esteatonecrose encapsulada.
Demais aspectos acima descritos.

Obs.:peritendinite insercional dos glúteos médio e mínimo bilateralmente.

## 2024-01-04 ENCOUNTER — Encounter: Admit: 2024-01-04 | Discharge: 2024-01-04 | Payer: MEDICAID

## 2024-02-13 ENCOUNTER — Encounter: Admit: 2024-02-13 | Discharge: 2024-02-13 | Payer: MEDICAID

## 2024-02-26 ENCOUNTER — Encounter: Admit: 2024-02-26 | Discharge: 2024-02-26 | Payer: MEDICAID

## 2024-02-26 NOTE — Telephone Encounter
 Reason for Conversation  Vaginal Pain    Background   Tonya Hayes is a 53 y.o female reporting severe pelvic pain, clear vaginal discharge, and a hard ball hanging out of vagina. Patient reports symptoms started yesterday after she leaned over to pick up a box. Patient requesting OBGYN referral. Due to acuity of pain and vaginal prolapse concerns, patient referred to ED for immediate evaluation. Patient agreeable to disposition and reports her roommate will take her to [facility] now. ED called and notified she will need ASL interpreter on arrival. Due to language barrier, an interpreter was present during the discussion with this patient.    Interpreter mode: Telephonic  Interpreter/ ID Number: [id number]    Disposition   GO TO ED/UCC NOW (OR TO OFFICE WITH PCP APPROVAL)    Reason for Disposition      Patient sounds very sick or weak to the triager    1. SYMPTOM: What's the main symptom you're concerned about? (e.g., pain, itching, dryness)      Feeling something hanging out of vagina - worse when bending over or picking something up   2. LOCATION: Where is the symptoms located? (e.g., inside/outside, left/right)      Inside   3. ONSET: When did the  symptoms  start?      8/24  4. PAIN: Is there any pain? If Yes, ask: How bad is it? (Scale: 1-10; mild, moderate, severe)      Severe   5. ITCHING: Is there any itching? If Yes, ask: How bad is it? (Scale: 1-10; mild, moderate, severe)      None reported   6. CAUSE: What do you think is causing the discharge? Have you had the same problem before? What happened then?      Unknown   7. OTHER SYMPTOMS: Do you have any other symptoms? (e.g., fever, itching, vaginal bleeding, pain with urination, injury to genital area, vaginal foreign body)      Clear thin discharge, pelvic pain  8. PREGNANCY: Is there any chance you are pregnant? When was your last menstrual period?      Denies periods    No Additional Information on file.    Protocols Used     Vaginal Symptoms-A-OH

## 2024-03-11 ENCOUNTER — Encounter: Admit: 2024-03-11 | Discharge: 2024-03-11 | Payer: MEDICAID

## 2024-03-11 NOTE — Telephone Encounter
 LVM stating was seen today in clinic and is missing note for work. No triage indicated on VM.     Routing to provider and team for review; please call patient with reply.

## 2024-03-11 NOTE — Telephone Encounter
 Called patient to ask if she misplaced work note she received from [facility] visit today 9/8. No answer, LVM.    Benton Mose, RN

## 2024-03-16 ENCOUNTER — Encounter: Admit: 2024-03-16 | Discharge: 2024-03-16 | Payer: MEDICAID

## 2024-03-22 ENCOUNTER — Encounter: Admit: 2024-03-22 | Discharge: 2024-03-22 | Payer: MEDICAID

## 2024-03-29 ENCOUNTER — Encounter: Admit: 2024-03-29 | Discharge: 2024-03-29 | Payer: MEDICAID

## 2024-04-03 ENCOUNTER — Encounter: Admit: 2024-04-03 | Discharge: 2024-04-03 | Payer: MEDICAID

## 2024-04-04 ENCOUNTER — Encounter: Admit: 2024-04-04 | Discharge: 2024-04-04 | Payer: MEDICAID

## 2024-04-05 ENCOUNTER — Encounter: Admit: 2024-04-05 | Discharge: 2024-04-05 | Payer: MEDICAID

## 2024-04-11 ENCOUNTER — Encounter: Admit: 2024-04-11 | Discharge: 2024-04-11 | Payer: MEDICAID

## 2024-04-19 ENCOUNTER — Encounter: Admit: 2024-04-19 | Discharge: 2024-04-19 | Payer: MEDICAID

## 2024-05-24 ENCOUNTER — Encounter: Admit: 2024-05-24 | Discharge: 2024-05-24 | Payer: MEDICAID

## 2024-05-24 ENCOUNTER — Ambulatory Visit: Admit: 2024-05-24 | Discharge: 2024-05-25 | Payer: MEDICAID

## 2024-05-27 ENCOUNTER — Encounter: Admit: 2024-05-27 | Discharge: 2024-05-27 | Payer: MEDICAID

## 2024-05-31 ENCOUNTER — Encounter: Admit: 2024-05-31 | Discharge: 2024-05-31 | Payer: MEDICAID

## 2024-06-02 ENCOUNTER — Encounter: Admit: 2024-06-02 | Discharge: 2024-06-02 | Payer: MEDICAID

## 2024-06-07 ENCOUNTER — Encounter: Admit: 2024-06-07 | Discharge: 2024-06-07 | Payer: MEDICAID

## 2024-06-14 ENCOUNTER — Encounter: Admit: 2024-06-14 | Discharge: 2024-06-14 | Payer: MEDICAID

## 2024-07-03 ENCOUNTER — Encounter: Admit: 2024-07-03 | Discharge: 2024-07-03 | Payer: MEDICAID

## 2024-07-06 ENCOUNTER — Encounter: Admit: 2024-07-06 | Discharge: 2024-07-06 | Payer: MEDICAID

## 2024-07-08 ENCOUNTER — Encounter: Admit: 2024-07-08 | Discharge: 2024-07-08 | Payer: MEDICAID

## 2024-07-09 ENCOUNTER — Encounter: Admit: 2024-07-09 | Discharge: 2024-07-09 | Payer: MEDICAID

## 2024-07-10 ENCOUNTER — Encounter: Admit: 2024-07-10 | Discharge: 2024-07-10 | Payer: MEDICAID

## 2024-07-12 ENCOUNTER — Encounter: Admit: 2024-07-12 | Discharge: 2024-07-12 | Payer: MEDICAID

## 2024-07-15 ENCOUNTER — Encounter: Admit: 2024-07-15 | Discharge: 2024-07-15 | Payer: MEDICAID

## 2024-07-15 NOTE — Telephone Encounter [36]
 Pt left voicemail requesting refill of insulin medication. Reports [facility] needs approval from MD to refill meds.

## 2024-07-19 ENCOUNTER — Encounter: Admit: 2024-07-19 | Discharge: 2024-07-19 | Payer: MEDICAID

## 2024-07-25 ENCOUNTER — Encounter: Admit: 2024-07-25 | Discharge: 2024-07-25 | Payer: MEDICAID

## 2024-07-26 ENCOUNTER — Encounter: Admit: 2024-07-26 | Discharge: 2024-07-26 | Payer: MEDICAID

## 2024-07-30 ENCOUNTER — Encounter: Admit: 2024-07-30 | Discharge: 2024-07-30 | Payer: MEDICAID

## 2024-08-02 ENCOUNTER — Ambulatory Visit: Admit: 2024-08-02 | Discharge: 2024-08-02 | Payer: MEDICAID

## 2024-08-02 ENCOUNTER — Encounter: Admit: 2024-08-02 | Discharge: 2024-08-02 | Payer: MEDICAID

## 2024-08-06 ENCOUNTER — Encounter: Admit: 2024-08-06 | Discharge: 2024-08-06 | Payer: MEDICAID
